# Patient Record
Sex: Female | Born: 1995 | Race: White | Hispanic: No | Marital: Single | Smoking: Never smoker
Health system: Southern US, Community
[De-identification: ages and names within clinical notes are randomized; demographics above are authoritative.]

## PROBLEM LIST (undated history)

## (undated) DIAGNOSIS — J45909 Unspecified asthma, uncomplicated: Secondary | ICD-10-CM

## (undated) HISTORY — PX: KNEE ARTHROSCOPY: SHX127

---
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
## 2015-07-17 ENCOUNTER — Ambulatory Visit: Payer: BC Managed Care – PPO | Admitting: Podiatry

## 2015-09-25 ENCOUNTER — Ambulatory Visit: Payer: BC Managed Care – PPO | Admitting: Podiatry

## 2020-02-03 ENCOUNTER — Other Ambulatory Visit: Payer: Self-pay | Admitting: Neurosurgery

## 2020-02-28 NOTE — Progress Notes (Signed)
[facility] Time, [place] - [address] AT [address] [place] [address] Phone: [phone] Fax: [phone]      Your procedure is scheduled on January 12  Report to [facility] Main Entrance "A" at [place].M., and check in at the Admitting office.  Call this number if you have problems the morning of surgery:  [phone]  Call [phone] if you have any questions prior to your surgery date Monday-Friday 8am-4pm    Remember:  Do not eat or drink after midnight the night before your surgery    Take these medicines the morning of surgery with A SIP OF WATER  albuterol (VENTOLIN HFA) if needed, Please bring all inhalers with you the day of surgery.  montelukast (SINGULAIR)  As of today, STOP taking any Aspirin (unless otherwise instructed by your surgeon) Aleve, Naproxen, Ibuprofen, Motrin, Advil, Goody's, BC's, all herbal medications, fish oil, and all vitamins.                      Do not wear jewelry, make up, or nail polish            Do not wear lotions, powders, perfumes, or deodorant.            Do not shave 48 hours prior to surgery.              Do not bring valuables to the hospital.            [facility] is not responsible for any belongings or valuables.  Do NOT Smoke (Tobacco/Vaping) or drink Alcohol 24 hours prior to your procedure If you use a CPAP at night, you may bring all equipment for your overnight stay.   Contacts, glasses, dentures or bridgework may not be worn into surgery.      For patients admitted to the hospital, discharge time will be determined by your treatment team.   Patients discharged the day of surgery will not be allowed to drive home, and someone needs to stay with them for 24 hours.    Special instructions:   [place]- Preparing For Surgery  Before surgery, you can play an important role. Because skin is not sterile, your skin needs to be as free of germs as possible. You  can reduce the number of germs on your skin by washing with CHG (chlorahexidine gluconate) Soap before surgery.  CHG is an antiseptic cleaner which kills germs and bonds with the skin to continue killing germs even after washing.    Oral Hygiene is also important to reduce your risk of infection.  Remember - BRUSH YOUR TEETH THE MORNING OF SURGERY WITH YOUR REGULAR TOOTHPASTE  Please do not use if you have an allergy to CHG or antibacterial soaps. If your skin becomes reddened/irritated stop using the CHG.  Do not shave (including legs and underarms) for at least 48 hours prior to first CHG shower. It is OK to shave your face.  Please follow these instructions carefully.   1. Shower the NIGHT BEFORE SURGERY and the MORNING OF SURGERY with CHG Soap.   2. If you chose to wash your hair, wash your hair first as usual with your normal shampoo.  3. After you shampoo, rinse your hair and body thoroughly to remove the shampoo.  4. Use CHG as you would any other liquid soap. You can apply CHG directly to the skin and wash gently with a  scrungie or a clean washcloth.   5. Apply the CHG Soap to your body ONLY FROM THE NECK DOWN.  Do not use on open wounds or open sores. Avoid contact with your eyes, ears, mouth and genitals (private parts). Wash Face and genitals (private parts)  with your normal soap.   6. Wash thoroughly, paying special attention to the area where your surgery will be performed.  7. Thoroughly rinse your body with warm water from the neck down.  8. DO NOT shower/wash with your normal soap after using and rinsing off the CHG Soap.  9. Pat yourself dry with a CLEAN TOWEL.  10. Wear CLEAN PAJAMAS to bed the night before surgery  11. Place CLEAN SHEETS on your bed the night of your first shower and DO NOT SLEEP WITH PETS.   Day of Surgery: Wear Clean/Comfortable clothing the morning of surgery Do not apply any deodorants/lotions.   Remember to brush your teeth WITH YOUR  REGULAR TOOTHPASTE.   Please read over the following fact sheets that you were given.

## 2020-03-02 ENCOUNTER — Encounter (HOSPITAL_COMMUNITY): Payer: Self-pay

## 2020-03-02 ENCOUNTER — Other Ambulatory Visit: Payer: Self-pay

## 2020-03-02 ENCOUNTER — Other Ambulatory Visit (HOSPITAL_COMMUNITY)
Admission: RE | Admit: 2020-03-02 | Discharge: 2020-03-02 | Disposition: A | Discharge status: Home / Self Care | Payer: 59 | Source: Ambulatory Visit | Attending: Neurosurgery | Admitting: Neurosurgery

## 2020-03-02 ENCOUNTER — Encounter (HOSPITAL_COMMUNITY)
Admission: RE | Admit: 2020-03-02 | Discharge: 2020-03-02 | Disposition: A | Discharge status: Home / Self Care | Payer: 59 | Source: Ambulatory Visit | Attending: Neurosurgery | Admitting: Neurosurgery

## 2020-03-02 DIAGNOSIS — Z20822 Contact with and (suspected) exposure to covid-19: Secondary | ICD-10-CM | POA: Insufficient documentation

## 2020-03-02 DIAGNOSIS — Z01812 Encounter for preprocedural laboratory examination: Secondary | ICD-10-CM | POA: Insufficient documentation

## 2020-03-02 HISTORY — DX: Unspecified asthma, uncomplicated: J45.909

## 2020-03-02 LAB — SARS CORONAVIRUS 2 (TAT 6-24 HRS): SARS Coronavirus 2: NEGATIVE

## 2020-03-02 LAB — TYPE AND SCREEN
ABO/RH(D): A POS
Antibody Screen: NEGATIVE

## 2020-03-02 LAB — SURGICAL PCR SCREEN
MRSA, PCR: NEGATIVE
Staphylococcus aureus: NEGATIVE

## 2020-03-02 NOTE — Progress Notes (Signed)
PCP - Dr Humphrey Rolls Cardiologist - n/a  Chest x-ray - n/a EKG - n/a Stress Test - n/a ECHO - n/a Cardiac Cath - n/a  Coronavirus Screening Covid test is scheduled on 03/02/20. Do you have any of the following symptoms:  Cough yes/no: No Fever (>100.44F)  yes/no: No Runny nose yes/no: No Sore throat yes/no: No Difficulty breathing/shortness of breath  yes/no: No  Have you traveled in the last 14 days and where? yes/no: No  Patient verbalized understanding of instructions that were given to them at the PAT appointment.

## 2020-03-03 NOTE — Anesthesia Preprocedure Evaluation (Signed)
Anesthesia Evaluation  Patient identified by MRN, date of birth, ID band Patient awake    Reviewed: Allergy & Precautions, NPO status , Patient's Chart, lab work & pertinent test results  History of Anesthesia Complications Negative for: history of anesthetic complications  Airway Mallampati: I  TM Distance: >3 FB Neck ROM: Full    Dental  (+) Dental Advisory Given, Teeth Intact   Pulmonary asthma , COPD: used inhaler this am.,  03/02/2020 SARS coronavirus NEG   breath sounds clear to auscultation       Cardiovascular negative cardio ROS   Rhythm:Regular Rate:Normal     Neuro/Psych negative neurological ROS     GI/Hepatic negative GI ROS, Neg liver ROS,   Endo/Other  negative endocrine ROS  Renal/GU negative Renal ROS     Musculoskeletal   Abdominal   Peds  Hematology negative hematology ROS (+)   Anesthesia Other Findings   Reproductive/Obstetrics                            Anesthesia Physical Anesthesia Plan  ASA: II  Anesthesia Plan: General   Post-op Pain Management:    Induction: Intravenous  PONV Risk Score and Plan: 3 and Dexamethasone and Scopolamine patch - Pre-op  Airway Management Planned: Oral ETT  Additional Equipment: None  Intra-op Plan:   Post-operative Plan: Extubation in OR  Informed Consent: I have reviewed the patients History and Physical, chart, labs and discussed the procedure including the risks, benefits and alternatives for the proposed anesthesia with the patient or authorized representative who has indicated his/her understanding and acceptance.     Dental advisory given  Plan Discussed with: CRNA and Surgeon  Anesthesia Plan Comments:        Anesthesia Quick Evaluation

## 2020-03-04 ENCOUNTER — Encounter (HOSPITAL_COMMUNITY)
Admission: RE | Disposition: A | Discharge status: Home / Self Care | Payer: Self-pay | Source: Home / Self Care | Attending: Neurosurgery

## 2020-03-04 ENCOUNTER — Inpatient Hospital Stay (HOSPITAL_COMMUNITY): Payer: 59 | Admitting: Anesthesiology

## 2020-03-04 ENCOUNTER — Other Ambulatory Visit: Payer: Self-pay

## 2020-03-04 ENCOUNTER — Inpatient Hospital Stay (HOSPITAL_COMMUNITY): Payer: 59

## 2020-03-04 ENCOUNTER — Inpatient Hospital Stay (HOSPITAL_COMMUNITY)
Admission: RE | Admit: 2020-03-04 | Discharge: 2020-03-07 | DRG: 030 | Disposition: A | Discharge status: Home / Self Care | Payer: 59 | Attending: Neurosurgery | Admitting: Neurosurgery

## 2020-03-04 ENCOUNTER — Encounter (HOSPITAL_COMMUNITY): Payer: Self-pay | Admitting: Neurosurgery

## 2020-03-04 DIAGNOSIS — Z419 Encounter for procedure for purposes other than remedying health state, unspecified: Secondary | ICD-10-CM

## 2020-03-04 DIAGNOSIS — D334 Benign neoplasm of spinal cord: Principal | ICD-10-CM | POA: Diagnosis present

## 2020-03-04 DIAGNOSIS — Z20822 Contact with and (suspected) exposure to covid-19: Secondary | ICD-10-CM | POA: Diagnosis present

## 2020-03-04 DIAGNOSIS — J45909 Unspecified asthma, uncomplicated: Secondary | ICD-10-CM | POA: Diagnosis present

## 2020-03-04 DIAGNOSIS — C719 Malignant neoplasm of brain, unspecified: Secondary | ICD-10-CM | POA: Diagnosis present

## 2020-03-04 HISTORY — PX: LAMINECTOMY: SHX219

## 2020-03-04 LAB — CBC
HCT: 38.7 % (ref 36.0–46.0)
Hemoglobin: 13 g/dL (ref 12.0–15.0)
MCH: 30.5 pg (ref 26.0–34.0)
MCHC: 33.6 g/dL (ref 30.0–36.0)
MCV: 90.8 fL (ref 80.0–100.0)
Platelets: 235 10*3/uL (ref 150–400)
RBC: 4.26 MIL/uL (ref 3.87–5.11)
RDW: 13 % (ref 11.5–15.5)
WBC: 10.7 10*3/uL — ABNORMAL HIGH (ref 4.0–10.5)
nRBC: 0 % (ref 0.0–0.2)

## 2020-03-04 LAB — COMPREHENSIVE METABOLIC PANEL
ALT: 44 U/L (ref 0–44)
ALT: 45 U/L — ABNORMAL HIGH (ref 0–44)
AST: 47 U/L — ABNORMAL HIGH (ref 15–41)
AST: 45 U/L — ABNORMAL HIGH (ref 15–41)
Albumin: 4.1 g/dL (ref 3.5–5.0)
Albumin: 3.9 g/dL (ref 3.5–5.0)
Alkaline Phosphatase: 48 U/L (ref 38–126)
Alkaline Phosphatase: 48 U/L (ref 38–126)
Anion gap: 11 (ref 5–15)
Anion gap: 10 (ref 5–15)
BUN: 7 mg/dL (ref 6–20)
BUN: 6 mg/dL (ref 6–20)
CO2: 23 mmol/L (ref 22–32)
CO2: 23 mmol/L (ref 22–32)
Calcium: 9.6 mg/dL (ref 8.9–10.3)
Calcium: 9.2 mg/dL (ref 8.9–10.3)
Chloride: 106 mmol/L (ref 98–111)
Chloride: 104 mmol/L (ref 98–111)
Creatinine, Ser: 0.57 mg/dL (ref 0.44–1.00)
Creatinine, Ser: 0.61 mg/dL (ref 0.44–1.00)
GFR, Estimated: >60 mL/min (ref >60)
GFR, Estimated: >60 mL/min (ref >60)
Glucose, Bld: 89 mg/dL (ref 70–99)
Glucose, Bld: 115 mg/dL — ABNORMAL HIGH (ref 70–99)
Potassium: 3.9 mmol/L (ref 3.5–5.1)
Potassium: 3.6 mmol/L (ref 3.5–5.1)
Sodium: 140 mmol/L (ref 135–145)
Sodium: 137 mmol/L (ref 135–145)
Total Bilirubin: 0.4 mg/dL (ref 0.3–1.2)
Total Bilirubin: 0.9 mg/dL (ref 0.3–1.2)
Total Protein: 6.8 g/dL (ref 6.5–8.1)
Total Protein: 6.4 g/dL — ABNORMAL LOW (ref 6.5–8.1)

## 2020-03-04 LAB — PHOSPHORUS: Phosphorus: 3.8 mg/dL (ref 2.5–4.6)

## 2020-03-04 LAB — POCT PREGNANCY, URINE: Preg Test, Ur: NEGATIVE

## 2020-03-04 LAB — MAGNESIUM: Magnesium: 1.6 mg/dL — ABNORMAL LOW (ref 1.7–2.4)

## 2020-03-04 LAB — ABO/RH: ABO/RH(D): A POS

## 2020-03-04 SURGERY — THORACIC LAMINECTOMY FOR TUMOR
Anesthesia: General | Site: Spine Lumbar

## 2020-03-04 MED ORDER — LIDOCAINE 2% (20 MG/ML) 5 ML SYRINGE
INTRAMUSCULAR | Status: AC
  Filled 2020-03-04: qty 5

## 2020-03-04 MED ORDER — BISACODYL 5 MG PO TBEC: 5.0000 mg | DELAYED_RELEASE_TABLET | Freq: Every day | ORAL | Status: DC | PRN

## 2020-03-04 MED ORDER — LIDOCAINE 2% (20 MG/ML) 5 ML SYRINGE
INTRAMUSCULAR | Status: DC | PRN
  Administered 2020-03-04: 40 mg via INTRAVENOUS

## 2020-03-04 MED ORDER — ACETAMINOPHEN 500 MG PO TABS
1000.0000 mg | ORAL_TABLET | Freq: Once | ORAL | Status: AC
  Administered 2020-03-04: 1000 mg via ORAL
  Filled 2020-03-04: qty 2

## 2020-03-04 MED ORDER — THROMBIN 20000 UNITS EX SOLR
CUTANEOUS | Status: DC | PRN
  Administered 2020-03-04: 20 mL via TOPICAL

## 2020-03-04 MED ORDER — ACETAMINOPHEN 325 MG PO TABS: 650.0000 mg | ORAL_TABLET | ORAL | Status: DC | PRN

## 2020-03-04 MED ORDER — LACTATED RINGERS IV SOLN: INTRAVENOUS | Status: DC

## 2020-03-04 MED ORDER — LORAZEPAM 2 MG/ML IJ SOLN: 1.0000 mg | INTRAMUSCULAR | Status: DC | PRN

## 2020-03-04 MED ORDER — BUPIVACAINE HCL (PF) 0.5 % IJ SOLN
INTRAMUSCULAR | Status: AC
  Filled 2020-03-04: qty 30

## 2020-03-04 MED ORDER — OXYCODONE HCL 5 MG/5ML PO SOLN: 5.0000 mg | Freq: Once | ORAL | Status: DC | PRN

## 2020-03-04 MED ORDER — DIAZEPAM 5 MG PO TABS
5.0000 mg | ORAL_TABLET | Freq: Four times a day (QID) | ORAL | Status: DC | PRN
  Administered 2020-03-04 – 2020-03-05 (×4): 5 mg via ORAL
  Filled 2020-03-04 (×4): qty 1

## 2020-03-04 MED ORDER — HYDROMORPHONE HCL 1 MG/ML IJ SOLN
0.2500 mg | INTRAMUSCULAR | Status: DC | PRN
Start: 2020-03-04 — End: 2020-03-04
  Administered 2020-03-04 (×2): 0.5 mg via INTRAVENOUS

## 2020-03-04 MED ORDER — SENNOSIDES-DOCUSATE SODIUM 8.6-50 MG PO TABS
1.0000 | ORAL_TABLET | Freq: Every evening | ORAL | Status: DC | PRN
Start: 2020-03-04 — End: 2020-03-07

## 2020-03-04 MED ORDER — ADULT MULTIVITAMIN W/MINERALS CH
1.0000 | ORAL_TABLET | Freq: Every day | ORAL | Status: DC
  Administered 2020-03-05 – 2020-03-07 (×3): 1 via ORAL
  Filled 2020-03-04 (×3): qty 1

## 2020-03-04 MED ORDER — DEXAMETHASONE 4 MG PO TABS
4.0000 mg | ORAL_TABLET | Freq: Four times a day (QID) | ORAL | Status: DC
  Administered 2020-03-04 – 2020-03-06 (×8): 4 mg via ORAL
  Filled 2020-03-04 (×8): qty 1

## 2020-03-04 MED ORDER — THROMBIN 20000 UNITS EX SOLR
CUTANEOUS | Status: AC
  Filled 2020-03-04: qty 20000

## 2020-03-04 MED ORDER — LIDOCAINE-EPINEPHRINE 0.5 %-1:200000 IJ SOLN
INTRAMUSCULAR | Status: AC
  Filled 2020-03-04: qty 1

## 2020-03-04 MED ORDER — SODIUM CHLORIDE 0.9 % IV SOLN: 250.0000 mL | INTRAVENOUS | Status: DC

## 2020-03-04 MED ORDER — FENTANYL CITRATE (PF) 100 MCG/2ML IJ SOLN
INTRAMUSCULAR | Status: DC | PRN
  Administered 2020-03-04 (×2): 50 ug via INTRAVENOUS
  Administered 2020-03-04: 150 ug via INTRAVENOUS

## 2020-03-04 MED ORDER — MORPHINE SULFATE (PF) 2 MG/ML IV SOLN
2.0000 mg | INTRAVENOUS | Status: DC | PRN
  Administered 2020-03-06: 2 mg via INTRAVENOUS
  Filled 2020-03-04: qty 1

## 2020-03-04 MED ORDER — CHLORHEXIDINE GLUCONATE CLOTH 2 % EX PADS: 6.0000 | MEDICATED_PAD | Freq: Once | CUTANEOUS | Status: DC

## 2020-03-04 MED ORDER — ROCURONIUM BROMIDE 10 MG/ML (PF) SYRINGE
PREFILLED_SYRINGE | INTRAVENOUS | Status: AC
  Filled 2020-03-04: qty 10

## 2020-03-04 MED ORDER — MIDAZOLAM HCL 5 MG/5ML IJ SOLN
INTRAMUSCULAR | Status: DC | PRN
  Administered 2020-03-04: 2 mg via INTRAVENOUS

## 2020-03-04 MED ORDER — ROCURONIUM BROMIDE 10 MG/ML (PF) SYRINGE
PREFILLED_SYRINGE | INTRAVENOUS | Status: DC | PRN
  Administered 2020-03-04: 20 mg via INTRAVENOUS
  Administered 2020-03-04: 10 mg via INTRAVENOUS
  Administered 2020-03-04: 70 mg via INTRAVENOUS

## 2020-03-04 MED ORDER — PROMETHAZINE HCL 25 MG/ML IJ SOLN: 6.2500 mg | INTRAMUSCULAR | Status: DC | PRN

## 2020-03-04 MED ORDER — POTASSIUM CHLORIDE IN NACL 20-0.9 MEQ/L-% IV SOLN
INTRAVENOUS | Status: DC
  Filled 2020-03-04 (×3): qty 1000

## 2020-03-04 MED ORDER — LIDOCAINE-EPINEPHRINE 0.5 %-1:200000 IJ SOLN
INTRAMUSCULAR | Status: DC | PRN
  Administered 2020-03-04: 4 mL

## 2020-03-04 MED ORDER — CHLORHEXIDINE GLUCONATE 0.12 % MT SOLN
15.0000 mL | Freq: Once | OROMUCOSAL | Status: AC
  Administered 2020-03-04: 15 mL via OROMUCOSAL
  Filled 2020-03-04: qty 15

## 2020-03-04 MED ORDER — DOCUSATE SODIUM 100 MG PO CAPS
100.0000 mg | ORAL_CAPSULE | Freq: Two times a day (BID) | ORAL | Status: DC
  Administered 2020-03-06 – 2020-03-07 (×3): 100 mg via ORAL
  Filled 2020-03-04 (×4): qty 1

## 2020-03-04 MED ORDER — ORAL CARE MOUTH RINSE: 15.0000 mL | Freq: Once | OROMUCOSAL | Status: AC

## 2020-03-04 MED ORDER — BUPIVACAINE HCL 0.5 % IJ SOLN
INTRAMUSCULAR | Status: DC | PRN
  Administered 2020-03-04: 20 mL

## 2020-03-04 MED ORDER — PHENYLEPHRINE 40 MCG/ML (10ML) SYRINGE FOR IV PUSH (FOR BLOOD PRESSURE SUPPORT)
PREFILLED_SYRINGE | INTRAVENOUS | Status: AC
  Filled 2020-03-04: qty 10

## 2020-03-04 MED ORDER — ONDANSETRON HCL 4 MG PO TABS: 4.0000 mg | ORAL_TABLET | Freq: Four times a day (QID) | ORAL | Status: DC | PRN

## 2020-03-04 MED ORDER — SUGAMMADEX SODIUM 200 MG/2ML IV SOLN
INTRAVENOUS | Status: DC | PRN
  Administered 2020-03-04: 150 mg via INTRAVENOUS

## 2020-03-04 MED ORDER — GABAPENTIN 300 MG PO CAPS
300.0000 mg | ORAL_CAPSULE | Freq: Three times a day (TID) | ORAL | Status: DC
  Administered 2020-03-04 – 2020-03-07 (×9): 300 mg via ORAL
  Filled 2020-03-04 (×9): qty 1

## 2020-03-04 MED ORDER — ONDANSETRON HCL 4 MG/2ML IJ SOLN
INTRAMUSCULAR | Status: DC | PRN
  Administered 2020-03-04: 4 mg via INTRAVENOUS

## 2020-03-04 MED ORDER — ACETAMINOPHEN 650 MG RE SUPP: 650.0000 mg | RECTAL | Status: DC | PRN

## 2020-03-04 MED ORDER — MAGNESIUM CITRATE PO SOLN: 1.0000 | Freq: Once | ORAL | Status: DC | PRN

## 2020-03-04 MED ORDER — MIDAZOLAM HCL 2 MG/2ML IJ SOLN: 0.5000 mg | Freq: Once | INTRAMUSCULAR | Status: DC | PRN

## 2020-03-04 MED ORDER — DEXAMETHASONE SODIUM PHOSPHATE 10 MG/ML IJ SOLN
INTRAMUSCULAR | Status: DC | PRN
  Administered 2020-03-04: 10 mg via INTRAVENOUS

## 2020-03-04 MED ORDER — CEFAZOLIN SODIUM-DEXTROSE 2-4 GM/100ML-% IV SOLN
2.0000 g | INTRAVENOUS | Status: AC
  Administered 2020-03-04: 2 g via INTRAVENOUS
  Filled 2020-03-04: qty 100

## 2020-03-04 MED ORDER — PHENOL 1.4 % MT LIQD: 1.0000 | OROMUCOSAL | Status: DC | PRN

## 2020-03-04 MED ORDER — BACITRACIN ZINC 500 UNIT/GM EX OINT
TOPICAL_OINTMENT | CUTANEOUS | Status: AC
  Filled 2020-03-04: qty 28.35

## 2020-03-04 MED ORDER — FOLIC ACID 1 MG PO TABS
1.0000 mg | ORAL_TABLET | Freq: Every day | ORAL | Status: DC
  Administered 2020-03-05 – 2020-03-07 (×3): 1 mg via ORAL
  Filled 2020-03-04 (×3): qty 1

## 2020-03-04 MED ORDER — DEXAMETHASONE SODIUM PHOSPHATE 4 MG/ML IJ SOLN: 4.0000 mg | Freq: Four times a day (QID) | INTRAMUSCULAR | Status: DC

## 2020-03-04 MED ORDER — SODIUM CHLORIDE 0.9% FLUSH: 3.0000 mL | INTRAVENOUS | Status: DC | PRN

## 2020-03-04 MED ORDER — LORAZEPAM 1 MG PO TABS
1.0000 mg | ORAL_TABLET | ORAL | Status: DC | PRN
Start: 2020-03-04 — End: 2020-03-07

## 2020-03-04 MED ORDER — OXYCODONE HCL 5 MG PO TABS: 5.0000 mg | ORAL_TABLET | Freq: Once | ORAL | Status: DC | PRN

## 2020-03-04 MED ORDER — ALBUTEROL SULFATE (2.5 MG/3ML) 0.083% IN NEBU: 2.5000 mg | INHALATION_SOLUTION | Freq: Four times a day (QID) | RESPIRATORY_TRACT | Status: DC | PRN

## 2020-03-04 MED ORDER — SCOPOLAMINE 1 MG/3DAYS TD PT72
1.0000 | MEDICATED_PATCH | TRANSDERMAL | Status: DC
  Administered 2020-03-04: 1.5 mg via TRANSDERMAL
  Filled 2020-03-04: qty 1

## 2020-03-04 MED ORDER — CHLORHEXIDINE GLUCONATE CLOTH 2 % EX PADS
6.0000 | MEDICATED_PAD | Freq: Once | CUTANEOUS | Status: DC
  Administered 2020-03-04: 6 via TOPICAL

## 2020-03-04 MED ORDER — THIAMINE HCL 100 MG/ML IJ SOLN: 100.0000 mg | Freq: Every day | INTRAMUSCULAR | Status: DC

## 2020-03-04 MED ORDER — FENTANYL CITRATE (PF) 250 MCG/5ML IJ SOLN
INTRAMUSCULAR | Status: AC
  Filled 2020-03-04: qty 5

## 2020-03-04 MED ORDER — PROPOFOL 10 MG/ML IV BOLUS
INTRAVENOUS | Status: DC | PRN
  Administered 2020-03-04: 200 mg via INTRAVENOUS

## 2020-03-04 MED ORDER — SODIUM CHLORIDE 0.9% FLUSH
3.0000 mL | Freq: Two times a day (BID) | INTRAVENOUS | Status: DC
  Administered 2020-03-05 – 2020-03-06 (×3): 3 mL via INTRAVENOUS

## 2020-03-04 MED ORDER — ONDANSETRON HCL 4 MG/2ML IJ SOLN: 4.0000 mg | Freq: Four times a day (QID) | INTRAMUSCULAR | Status: DC | PRN

## 2020-03-04 MED ORDER — OXYCODONE HCL ER 10 MG PO T12A
10.0000 mg | EXTENDED_RELEASE_TABLET | Freq: Two times a day (BID) | ORAL | Status: DC
  Administered 2020-03-04 – 2020-03-07 (×6): 10 mg via ORAL
  Filled 2020-03-04 (×6): qty 1

## 2020-03-04 MED ORDER — DEXAMETHASONE SODIUM PHOSPHATE 10 MG/ML IJ SOLN
INTRAMUSCULAR | Status: AC
  Filled 2020-03-04: qty 1

## 2020-03-04 MED ORDER — HYDROMORPHONE HCL 1 MG/ML IJ SOLN
INTRAMUSCULAR | Status: AC
  Filled 2020-03-04: qty 1

## 2020-03-04 MED ORDER — THIAMINE HCL 100 MG PO TABS
100.0000 mg | ORAL_TABLET | Freq: Every day | ORAL | Status: DC
  Administered 2020-03-05 – 2020-03-07 (×3): 100 mg via ORAL
  Filled 2020-03-04 (×3): qty 1

## 2020-03-04 MED ORDER — HYDROCODONE-ACETAMINOPHEN 5-325 MG PO TABS
1.0000 | ORAL_TABLET | ORAL | Status: DC | PRN
  Administered 2020-03-04 – 2020-03-07 (×8): 1 via ORAL
  Filled 2020-03-04 (×8): qty 1

## 2020-03-04 MED ORDER — CELECOXIB 200 MG PO CAPS
200.0000 mg | ORAL_CAPSULE | Freq: Two times a day (BID) | ORAL | Status: DC
  Administered 2020-03-04 – 2020-03-07 (×6): 200 mg via ORAL
  Filled 2020-03-04 (×7): qty 1

## 2020-03-04 MED ORDER — ADULT MULTIVITAMIN W/MINERALS CH: 1.0000 | ORAL_TABLET | Freq: Every day | ORAL | Status: DC

## 2020-03-04 MED ORDER — PROPOFOL 10 MG/ML IV BOLUS
INTRAVENOUS | Status: AC
  Filled 2020-03-04: qty 20

## 2020-03-04 MED ORDER — PHENYLEPHRINE 40 MCG/ML (10ML) SYRINGE FOR IV PUSH (FOR BLOOD PRESSURE SUPPORT)
PREFILLED_SYRINGE | INTRAVENOUS | Status: DC | PRN
  Administered 2020-03-04 (×2): 40 ug via INTRAVENOUS

## 2020-03-04 MED ORDER — MENTHOL 3 MG MT LOZG
1.0000 | LOZENGE | OROMUCOSAL | Status: DC | PRN
  Filled 2020-03-04: qty 9

## 2020-03-04 MED ORDER — THIAMINE HCL 100 MG PO TABS: 100.0000 mg | ORAL_TABLET | Freq: Every day | ORAL | Status: DC

## 2020-03-04 MED ORDER — HYDROCODONE-ACETAMINOPHEN 7.5-325 MG PO TABS
2.0000 | ORAL_TABLET | ORAL | Status: DC | PRN
  Administered 2020-03-05: 2 via ORAL
  Filled 2020-03-04 (×2): qty 2

## 2020-03-04 MED ORDER — MONTELUKAST SODIUM 10 MG PO TABS
10.0000 mg | ORAL_TABLET | Freq: Every day | ORAL | Status: DC | PRN
Start: 2020-03-04 — End: 2020-03-07
  Filled 2020-03-04: qty 1

## 2020-03-04 MED ORDER — CHLORHEXIDINE GLUCONATE CLOTH 2 % EX PADS
6.0000 | MEDICATED_PAD | Freq: Every day | CUTANEOUS | Status: DC
  Administered 2020-03-04 – 2020-03-05 (×2): 6 via TOPICAL

## 2020-03-04 MED ORDER — MIDAZOLAM HCL 2 MG/2ML IJ SOLN
INTRAMUSCULAR | Status: AC
  Filled 2020-03-04: qty 2

## 2020-03-04 MED ORDER — PROPOFOL 10 MG/ML IV BOLUS
INTRAVENOUS | Status: AC
  Filled 2020-03-04: qty 40

## 2020-03-04 MED ORDER — MEPERIDINE HCL 25 MG/ML IJ SOLN: 6.2500 mg | INTRAMUSCULAR | Status: DC | PRN

## 2020-03-04 SURGICAL SUPPLY — 73 items
ADH SKN CLS APL DERMABOND .7 (GAUZE/BANDAGES/DRESSINGS) ×1
BAND INSRT 18 STRL LF DISP RB (MISCELLANEOUS) ×2
BAND RUBBER #18 3X1/16 STRL (MISCELLANEOUS) ×4 IMPLANT
BLADE CLIPPER SURG (BLADE) IMPLANT
BLADE SURG 11 STRL SS (BLADE) IMPLANT
BUR MATCHSTICK NEURO 3.0 LAGG (BURR) ×1 IMPLANT
CANISTER SUCT 3000ML PPV (MISCELLANEOUS) ×2 IMPLANT
CARTRIDGE OIL MAESTRO DRILL (MISCELLANEOUS) ×1 IMPLANT
CNTNR URN SCR LID CUP LEK RST (MISCELLANEOUS) IMPLANT
CONT SPEC 4OZ CLIKSEAL STRL BL (MISCELLANEOUS) ×2 IMPLANT
CONT SPEC 4OZ STRL OR WHT (MISCELLANEOUS) ×2
COVER WAND RF STERILE (DRAPES) ×1 IMPLANT
DERMABOND ADVANCED (GAUZE/BANDAGES/DRESSINGS) ×1
DERMABOND ADVANCED .7 DNX12 (GAUZE/BANDAGES/DRESSINGS) ×1 IMPLANT
DIFFUSER DRILL AIR PNEUMATIC (MISCELLANEOUS) ×2 IMPLANT
DRAPE LAPAROTOMY 100X72X124 (DRAPES) ×2 IMPLANT
DRAPE LAPAROTOMY T 102X78X121 (DRAPES) IMPLANT
DRAPE MICROSCOPE LEICA (MISCELLANEOUS) ×2 IMPLANT
DRAPE SURG 17X23 STRL (DRAPES) ×4 IMPLANT
DRSG OPSITE POSTOP 4X8 (GAUZE/BANDAGES/DRESSINGS) ×1 IMPLANT
DRSG TELFA 3X8 NADH (GAUZE/BANDAGES/DRESSINGS) ×2 IMPLANT
ELECT REM PT RETURN 9FT ADLT (ELECTROSURGICAL) ×2
ELECTRODE REM PT RTRN 9FT ADLT (ELECTROSURGICAL) ×1 IMPLANT
GAUZE 4X4 16PLY RFD (DISPOSABLE) IMPLANT
GAUZE SPONGE 4X4 12PLY STRL (GAUZE/BANDAGES/DRESSINGS) ×1 IMPLANT
GLOVE BIO SURGEON STRL SZ8 (GLOVE) ×1 IMPLANT
GLOVE ECLIPSE 6.5 STRL STRAW (GLOVE) ×3 IMPLANT
GLOVE ECLIPSE 7.5 STRL STRAW (GLOVE) ×3 IMPLANT
GLOVE EXAM NITRILE XL STR (GLOVE) IMPLANT
GLOVE SURG SS PI 8.5 STRL IVOR (GLOVE) ×1
GLOVE SURG SS PI 8.5 STRL STRW (GLOVE) IMPLANT
GOWN STRL REUS W/ TWL LRG LVL3 (GOWN DISPOSABLE) IMPLANT
GOWN STRL REUS W/ TWL XL LVL3 (GOWN DISPOSABLE) IMPLANT
GOWN STRL REUS W/TWL 2XL LVL3 (GOWN DISPOSABLE) ×3 IMPLANT
GOWN STRL REUS W/TWL LRG LVL3 (GOWN DISPOSABLE) ×4
GOWN STRL REUS W/TWL XL LVL3 (GOWN DISPOSABLE) ×2
HEMOSTAT SURGICEL 2X14 (HEMOSTASIS) IMPLANT
KIT BASIN OR (CUSTOM PROCEDURE TRAY) ×2 IMPLANT
KIT TURNOVER KIT B (KITS) ×2 IMPLANT
KNIFE ARACHNOID DISP AM-24-XSB (BLADE) ×1 IMPLANT
NDL SPNL 20GX3.5 QUINCKE YW (NEEDLE) IMPLANT
NDL SPNL 25GX3.5 QUINCKE BL (NEEDLE) IMPLANT
NEEDLE HYPO 22GX1.5 SAFETY (NEEDLE) ×2 IMPLANT
NEEDLE SPNL 20GX3.5 QUINCKE YW (NEEDLE) ×4 IMPLANT
NEEDLE SPNL 25GX3.5 QUINCKE BL (NEEDLE) ×2 IMPLANT
NS IRRIG 1000ML POUR BTL (IV SOLUTION) ×2 IMPLANT
OIL CARTRIDGE MAESTRO DRILL (MISCELLANEOUS) ×2
PACK LAMINECTOMY NEURO (CUSTOM PROCEDURE TRAY) ×2 IMPLANT
PAD DRESSING TELFA 3X8 NADH (GAUZE/BANDAGES/DRESSINGS) IMPLANT
PATTIES SURGICAL .5 X.5 (GAUZE/BANDAGES/DRESSINGS) IMPLANT
PATTIES SURGICAL .5 X3 (DISPOSABLE) IMPLANT
PATTIES SURGICAL 1/4 X 3 (GAUZE/BANDAGES/DRESSINGS) ×1 IMPLANT
SPONGE LAP 4X18 RFD (DISPOSABLE) IMPLANT
SPONGE SURGIFOAM ABS GEL 100 (HEMOSTASIS) ×1 IMPLANT
STAPLER VISISTAT 35W (STAPLE) ×2 IMPLANT
SUT BONE WAX W31G (SUTURE) IMPLANT
SUT ETHILON 4 0 PS 2 18 (SUTURE) ×1 IMPLANT
SUT NURALON 4 0 TR CR/8 (SUTURE) ×1 IMPLANT
SUT PROLENE 5 0 C1 (SUTURE) ×2 IMPLANT
SUT PROLENE 6 0 BV (SUTURE) ×1 IMPLANT
SUT VIC AB 0 CT1 18XCR BRD8 (SUTURE) IMPLANT
SUT VIC AB 0 CT1 8-18 (SUTURE) ×6
SUT VIC AB 2-0 CT1 18 (SUTURE) ×1 IMPLANT
SUT VIC AB 3-0 SH 8-18 (SUTURE) ×3 IMPLANT
SUT VICRYL 4-0 PS2 18IN ABS (SUTURE) IMPLANT
SYR CONTROL 10ML LL (SYRINGE) ×2 IMPLANT
SYR TB 1ML LUER SLIP (SYRINGE) ×1 IMPLANT
TOWEL GREEN STERILE (TOWEL DISPOSABLE) ×2 IMPLANT
TOWEL GREEN STERILE FF (TOWEL DISPOSABLE) ×2 IMPLANT
TRAY FOLEY MTR SLVR 14FR STAT (SET/KITS/TRAYS/PACK) ×2 IMPLANT
TRAY FOLEY MTR SLVR 16FR STAT (SET/KITS/TRAYS/PACK) IMPLANT
TUBE CONNECTING 12X1/4 (SUCTIONS) IMPLANT
WATER STERILE IRR 1000ML POUR (IV SOLUTION) ×2 IMPLANT

## 2020-03-04 NOTE — Transfer of Care (Signed)
Immediate Anesthesia Transfer of Care Note  Patient: Emily Bowers  Procedure(s) Performed: Thoracic Twelve- Lumbar One Laminectomy for intradural tumor resection (N/A Spine Lumbar)  Patient Location: PACU  Anesthesia Type:General  Level of Consciousness: awake and alert   Airway & Oxygen Therapy: Patient Spontanous Breathing and Patient connected to nasal cannula oxygen  Post-op Assessment: Report given to RN and Post -op Vital signs reviewed and stable  Post vital signs: Reviewed and stable  Last Vitals:  Vitals Value Taken Time  BP 115/79 03/04/20 1350  Temp    Pulse 100 03/04/20 1351  Resp 48 03/04/20 1351  SpO2 100 % 03/04/20 1351  Vitals shown include unvalidated device data.  Last Pain:  Vitals:   03/04/20 0739  PainSc: 3       Patients Stated Pain Goal: 3 (66/44/03 4742)  Complications: No complications documented.

## 2020-03-04 NOTE — Progress Notes (Signed)
Patient complains of severe tingling down the left leg. Dr. Christella Noa at bedside and indicates this is to be expected. Patient understands.

## 2020-03-04 NOTE — Op Note (Signed)
03/04/2020  2:37 PM  PATIENT:  Emily Bowers  25 y.o. female  PRE-OPERATIVE DIAGNOSIS:  Ependymoma  POST-OPERATIVE DIAGNOSIS:  Cauda equina mass PROCEDURE:  Procedure(s): Thoracic Twelve- Lumbar One Laminectomy for intradural tumor resection, microdissection.   SURGEON: Surgeon(s): Ashok Pall, MD Kary Kos, MD  ASSISTANTS:Cram, Dominica Severin  ANESTHESIA:   general  EBL:  Total I/O In: 1400 [I.V.:1400] Out: 21 [Urine:200; Blood:75]  BLOOD ADMINISTERED:none  CELL SAVER GIVEN:none  COUNT:per nursing  DRAINS: none   SPECIMEN:  Source of Specimen:  intradural   DICTATION: Emily Locus Naas was taken to the operating room, intubated, and placed under a general anesthetic without difficulty. She was positioned prone on a Wilson frame with all pressure points properly padded. She was prepped and draped in a sterile manner. I infiltrated lidocaine in the planned incision. I opened the skin with a 10 blade, dissected sharply to the thoracolumbar fascia, then exposed the lamina of T11,12,L1, bilaterally. I performed a laminectomy of T12 and L1 and exposed the thecal sac.  I opened the dura with a 15 blade, and secured the dural edges with suture to lay out the intradural contents. The conus was proximal to the mass. The nerve roots were not freely mobile, and I did take a number of films to establish my location. Finally I exposed a greyish membrane/capsule, and was able to sweep the nerve roots to the right exposing the mass. It was cystic on the MRI, so I placed a needle into the mass. No fluid emanated from the needle, even after aspiration. I then made a linear incision in the capsule and removed solid material from the interior of the mass. I was able to lift the mass and roll it rostrally, I was also able to roll  It caudally. With Dr. Windy Carina assistance we delivered the mass posteriorly above the nerve roots. With countertraction and the bipolar cautery we were able to see the attachment to a  neural structure leading directly into the tumor. That was divided with scissors and cautery, and the distal connection was too identified and divided in similar fashion. There was no significant bleeding, and irrigation controlled the oozing from the canal. The tumor resection was performed with microdissection.  I closed the dural incision primarily with suture, prolene. I then closed the incision approximating the thoracolumbar fascia, subcutaneous, and subcuticular planes with vicryl suture. I applied a sterile dressing. She was extubated in the room and moving all extremities.  PLAN OF CARE: Admit to inpatient   PATIENT DISPOSITION:  PACU - hemodynamically stable.   Delay start of Pharmacological VTE agent (>24hrs) due to surgical blood loss or risk of bleeding:  yes

## 2020-03-04 NOTE — H&P (Signed)
BP 119/76   Pulse 93   Temp 98.3 F (36.8 C)   Resp 18   Ht 5\' 7"  (1.702 m)   Wt 58.1 kg   LMP 02/20/2020 (Approximate)   SpO2 99%   BMI 20.06 kg/m  Emily Bowers is a young woman who has been having pain for about a year, especially in the left lower extremity, left lower back.  She has experienced weakness in the left lower extremity, where it will simply give out.  The pain became worse, since July of this year, which prompted an MRI scan of the lumbar spine.  What that showed was an intradural mass located mainly behind the body of T12.  The mass is about 1.3 x 1.2 x 3 cm in length.  It is cystic to a degree.  It could be an ependymoma, it could be a nerve sheath tumor.  She weighs 131 pounds.  Temperature is 97, blood pressure is 115/81, pulse is 92, pain is 9/10.  She has been taking Hydrocodone for the pain and Gabapentin.  She is otherwise healthy.  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  She played college ball for approximately a year, so she is in shape and was well aware that things were not quite right.  She finds it easier to get in and out of her truck than it is to get in and out of her Mustang.  That also has a clutch, and she has not been driving much because of the weakness she experiences in the left lower extremity.     PHYSICAL EXAMINATION :  She is alert, oriented by 4. She answers all questions appropriately.  Memory, language, attention span, and fund of knowledge are normal.  Speech is clear and fluent.  Hearing is intact to voice.  She has normal shoulder shrug. Normal muscle tone and bulk.  Romberg is negative.  She has 5/5 strength in the right lower extremity weakness in the left hip flexors, some in the quadriceps 4/5.  Dorsiflexion and plantar flexion appear to be normal.  She has normal strength 5/5 in the upper extremities.  2+ reflexes throughout.  No clonus.  No Hoffmann sign.     MEDICATIONS :  She also takes Ibuprofen 1 tablet 3 times a  day.     ALLERGIES :  No known drug allergies.     PAST SURGICAL HISTORY :  She had a meniscus surgery on the left knee in 2018.     REVIEW OF SYSTEMS :  Positive for weakness in the left lower extremity, pain, and back pain.     PAST MEDICAL HISTORY :  Also includes asthma.  She has no bowel or bladder dysfunction.  She does live alone.  She does not have children.  She does use alcohol.  She does not smoke.     PLAN :  I will see her after January 1st to resect the tumor.  It certainly needs to come out.  I think it is more likely an ependymoma than a nerve sheath tumor, but it could be one or the other.  I did explain that if it is a nerve sheath tumor and we would have to take the nerve root as well, I will use some neuromonitoring for the lower extremities.  I would expect her to be in the hospital approximately 3 days.  I would expect her to be walking when she leaves the hospital.  I told her this does  not involve fusion. No screws.  No hardware or anything like that.  Risks and benefits, bleeding, infection, damage to the spinal cord, damage to the nerve roots, no improvement in her symptoms, inability to remove the tumor, recurrence of the tumor, and other risks were explained.  She understands and wishes to proceed.

## 2020-03-04 NOTE — Anesthesia Procedure Notes (Signed)
Procedure Name: Intubation Date/Time: 03/04/2020 10:10 AM Performed by: Inda Coke, CRNA Pre-anesthesia Checklist: Patient identified, Emergency Drugs available, Suction available and Patient being monitored Patient Re-evaluated:Patient Re-evaluated prior to induction Oxygen Delivery Method: Circle System Utilized Preoxygenation: Pre-oxygenation with 100% oxygen Induction Type: IV induction Ventilation: Mask ventilation without difficulty Laryngoscope Size: Mac and 3 Grade View: Grade I Tube type: Oral Tube size: 7.0 mm Number of attempts: 1 Airway Equipment and Method: Stylet and Oral airway Placement Confirmation: ETT inserted through vocal cords under direct vision,  positive ETCO2 and breath sounds checked- equal and bilateral Secured at: 22 cm Tube secured with: Tape Dental Injury: Teeth and Oropharynx as per pre-operative assessment

## 2020-03-04 NOTE — Anesthesia Postprocedure Evaluation (Signed)
Anesthesia Post Note  Patient: Emily Bowers  Procedure(s) Performed: Thoracic Twelve- Lumbar One Laminectomy for intradural tumor resection (N/A Spine Lumbar)     Patient location during evaluation: PACU Anesthesia Type: General Level of consciousness: awake and alert, patient cooperative and oriented Pain management: pain level controlled Vital Signs Assessment: post-procedure vital signs reviewed and stable Respiratory status: spontaneous breathing, nonlabored ventilation and respiratory function stable Cardiovascular status: blood pressure returned to baseline and stable Postop Assessment: no apparent nausea or vomiting Anesthetic complications: no   No complications documented.  Last Vitals:  Vitals:   03/04/20 1515 03/04/20 1530  BP: 111/71 113/82  Pulse: 88 83  Resp: 16 20  Temp:  37 C  SpO2: 97% 96%    Last Pain:  Vitals:   03/04/20 1530  PainSc: 0-No pain                 Edythe Riches,E. Rutherford Alarie

## 2020-03-05 ENCOUNTER — Encounter (HOSPITAL_COMMUNITY): Payer: Self-pay | Admitting: Neurosurgery

## 2020-03-05 LAB — SURGICAL PATHOLOGY

## 2020-03-05 MED ORDER — HEPARIN SODIUM (PORCINE) 5000 UNIT/ML IJ SOLN
5000.0000 [IU] | Freq: Three times a day (TID) | INTRAMUSCULAR | Status: DC
  Administered 2020-03-05 – 2020-03-07 (×6): 5000 [IU] via SUBCUTANEOUS
  Filled 2020-03-05 (×6): qty 1

## 2020-03-05 NOTE — Progress Notes (Signed)
Patient ID: Rolene Arbour, female   DOB: 10-30-1995, 25 y.o.   MRN: [id number] BP 115/75 (BP Location: Right Arm)   Pulse 86   Temp 99.1 F (37.3 C) (Oral)   Resp (!) 22   Ht 5\' 7"  (1.702 m)   Wt 58.1 kg   LMP 02/20/2020 (Approximate)   SpO2 98%   BMI 20.06 kg/m  Alert and oriented x 4, speech clear and fluent Weakness left lower extremity Bedrest today

## 2020-03-06 NOTE — Progress Notes (Signed)
Patient ID: Emily Bowers, female   DOB: 01-Jan-1996, 25 y.o.   MRN: [id number] BP 110/74   Pulse 61   Temp 97.9 F (36.6 C) (Oral)   Resp 19   Ht 5\' 7"  (1.702 m)   Wt 58.1 kg   LMP 02/20/2020 (Approximate)   SpO2 98%   BMI 20.06 kg/m  Alert and oriented x 4, speech is clear and fluent Moving lower extremities better Wound is clean , no signs of infection.

## 2020-03-07 MED ORDER — OXYCODONE HCL ER 10 MG PO T12A: 10.0000 mg | EXTENDED_RELEASE_TABLET | Freq: Two times a day (BID) | ORAL | 0 refills | Status: AC

## 2020-03-07 MED ORDER — HYDROCODONE-ACETAMINOPHEN 5-325 MG PO TABS: 1.0000 | ORAL_TABLET | ORAL | 0 refills | Status: AC | PRN

## 2020-03-07 MED ORDER — DOCUSATE SODIUM 100 MG PO CAPS: 100.0000 mg | ORAL_CAPSULE | Freq: Two times a day (BID) | ORAL | 2 refills | Status: AC

## 2020-03-07 NOTE — Progress Notes (Signed)
Patient ID: Emily Bowers, female   DOB: 25-Feb-1995, 25 y.o.   MRN: [id number] BP 102/72   Pulse (!) 56   Temp 99.9 F (37.7 C) (Oral)   Resp (!) 21   Ht 5\' 7"  (1.702 m)   Wt 58.1 kg   LMP 02/20/2020 (Approximate)   SpO2 97%   BMI 20.06 kg/m  Alert and oriented x 4, speech is clear and fluent Moving lower extremities, mild weakness in the left lower extremity Wound is clean, dry, no signs of infection

## 2020-03-07 NOTE — Progress Notes (Signed)
CSW met with patient to discuss substance use concerns. CSW noted patient had a visitor and requested visitor to remain in the room during assessment. Patient denied any illicit substance use but reported some drinking. CSW noted patient was minimizing her ETOH use and when asked to clarify stated she drinks almost daily about 10-15 beers. CSW discussed treatment options and noted patient reported while she has thought about it before she declined referrals at this time. Patient did accept resources. CSW encouraged patient to consider treatment and to reach back out if she would like referral for treatment.    03/07/20 1022  OTHER  Substance Abuse Education Offered Yes  Substance abuse interventions Patient Counseling;Educational Materials;Referral to (must comment) (Referral offered but declined)  (CAGE-AID) Substance Abuse Screening Tool  Have You Ever Felt You Ought to Cut Down on Your Drinking or Drug Use? 1  Have People Annoyed You By Critizing Your Drinking Or Drug Use? 1  Have You Felt Bad Or Guilty About Your Drinking Or Drug Use? 0  Have You Ever Had a Drink or Used Drugs First Thing In The Morning to Steady Your Nerves or to Get Rid of a Hangover? 0  CAGE-AID Score 2

## 2020-03-07 NOTE — Evaluation (Signed)
Occupational Therapy Evaluation Patient Details Name: Emily Bowers MRN: [id number] DOB: [birth year] Today's Date: 03/07/2020    History of Present Illness Pt is a 25 yo female s/p T12-L1 Laminectomy for intradural tumor resection s/p cauda equina mass. PMHx: sleep apnea, knee arthroscopy   Clinical Impression   Pt PTA: Pt living alone, independent with ADL, mobility with crutch or RW due to pain in L LE. Pt currently, requires increased time for ADL; able to perform LB ADL with figure 4 technique. Pt education on back precautions and requirements for LB ADL. Pt education of AE to use as needed. Pt reports having reacher and sock aid. Pt supervisionA for ADL and supervisionA to minguardA for mobility with no AD and fair balance. Pt does not require continued OT skilled services as pt at her functional ADL and mobility. OT signing off.     Follow Up Recommendations  No OT follow up;Supervision - Intermittent (initially)    Equipment Recommendations  None recommended by OT    Recommendations for Other Services       Precautions / Restrictions Precautions Precautions: Back Precaution Booklet Issued: Yes (comment) Precaution Comments: handout provided and education completed Restrictions Weight Bearing Restrictions: No      Mobility Bed Mobility Overal bed mobility: Modified Independent             General bed mobility comments: cues for log roll technique    Transfers Overall transfer level: Needs assistance Equipment used: None Transfers: Sit to/from Stand Sit to Stand: Supervision;Modified independent (Device/Increase time)         General transfer comment: safe technique, no assist needed.    Balance Overall balance assessment: Mild deficits observed, not formally tested                                         ADL either performed or assessed with clinical judgement   ADL Overall ADL's : At baseline                                      Functional mobility during ADLs: Supervision/safety General ADL Comments: Pt requires increased time for ADL; able to perform LB ADL with figure 4 technique. Pt education on back precautions and requirements for LB ADL. Pt education of AE to use as needed. Pt reports having reacher and sock aid.     Vision Baseline Vision/History: No visual deficits Patient Visual Report: No change from baseline Vision Assessment?: Yes;No apparent visual deficits Eye Alignment: Within Functional Limits Ocular Range of Motion: Within Functional Limits Alignment/Gaze Preference: Within Defined Limits Additional Comments: Pt scanning environment; able to walk without having to look L or R peripherally to avoid obstacles.     Perception     Praxis      Pertinent Vitals/Pain Pain Assessment: Faces Faces Pain Scale: Hurts a little bit Pain Location: surgery site Pain Descriptors / Indicators: Discomfort;Tightness Pain Intervention(s): Monitored during session     Hand Dominance Right   Extremity/Trunk Assessment Upper Extremity Assessment Upper Extremity Assessment: Overall WFL for tasks assessed   Lower Extremity Assessment Lower Extremity Assessment: LLE deficits/detail LLE Deficits / Details: numb/feeling asleep, mildly uncoordinated LLE Coordination: decreased gross motor   Cervical / Trunk Assessment Cervical / Trunk Assessment: Other exceptions Cervical / Trunk Exceptions: surgical back   Communication  Communication Communication: No difficulties   Cognition Arousal/Alertness: Awake/alert Behavior During Therapy: WFL for tasks assessed/performed Overall Cognitive Status: Within Functional Limits for tasks assessed                                     General Comments  Pt's mother in room; pt education complete for back precautions and ways to incorporate safe movement.    Exercises     Shoulder Instructions      Home Living Family/patient expects to  be discharged to:: Private residence Living Arrangements: Alone Available Help at Discharge: Family;Available PRN/intermittently Type of Home: House Home Access: Stairs to enter [employer] of Steps: 3   Home Layout: One level     Bathroom Shower/Tub: Occupational psychologist: Standard     Home Equipment: Crutches;Bedside commode;Walker - 2 wheels          Prior Functioning/Environment Level of Independence: Needs assistance  Gait / Transfers Assistance Needed: Increased time; used crutch ADL's / Homemaking Assistance Needed: Independent   Comments: Emily Bowers lives 7 miles away        OT Problem List: Decreased activity tolerance;Pain      OT Treatment/Interventions:      OT Goals(Current goals can be found in the care plan section) Acute Rehab OT Goals Patient Stated Goal: to go home OT Goal Formulation: All assessment and education complete, DC therapy Potential to Achieve Goals: Good  OT Frequency:     Barriers to D/C:            Co-evaluation              AM-PAC OT "6 Clicks" Daily Activity     Outcome Measure Help from another person eating meals?: None Help from another person taking care of personal grooming?: None Help from another person toileting, which includes using toliet, bedpan, or urinal?: None Help from another person bathing (including washing, rinsing, drying)?: None Help from another person to put on and taking off regular upper body clothing?: None Help from another person to put on and taking off regular lower body clothing?: None 6 Click Score: 24   End of Session Nurse Communication: Mobility status  Activity Tolerance: Patient tolerated treatment well Patient left: in bed;with call bell/phone within reach;with family/visitor present  OT Visit Diagnosis: Unsteadiness on feet (R26.81);Muscle weakness (generalized) (M62.81)                Time: 0349-1791 OT Time Calculation (min): 27 min Charges:  OT General  Charges $OT Visit: 1 Visit OT Evaluation $OT Eval Moderate Complexity: 1 Mod  Jefferey Pica, OTR/L Acute Rehabilitation Services Pager: [phone] Office: [phone]   [id number] C 03/07/2020, 12:43 PM

## 2020-03-07 NOTE — Discharge Summary (Signed)
  Physician Discharge Summary  Patient ID: Emily Bowers MRN: [id number] DOB/AGE: 09-04-1995 25 y.o.  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)   Discharged Condition: Stable  Hospital Course:  Emily Bowers is a 25 y.o. female who underwent laminectomy and resection of a spinal intradural mass, thought to be either an ependymoma or nerve sheath tumor.  Postoperatively, she was mobilized with PT, OT, and her pain was controlled with vicodin and oxycodone.  On the day of discharge, she was at her preoperative neurologic baseline, was cleared by PT/OT, and was seen and examined by Dr. Christella Noa who deemed her ready for d/c home on 03/07/20.   Treatments: Surgery - laminectomy and resection of intradural spinal mass  Discharge Exam: Blood pressure 114/63, pulse 94, temperature 99.9 F (37.7 C), temperature source Oral, resp. rate 19, height 5\' 7"  (1.702 m), weight 58.1 kg, last menstrual period 02/20/2020, SpO2 96 %. Awake, alert, oriented Speech fluent, appropriate CN grossly intact 5/5 BUE/BLE Wound c/d/i  Disposition: Discharge disposition: 01-Home or Self Care        Allergies as of 03/07/2020   No Known Allergies     Medication List    TAKE these medications   albuterol 108 (90 Base) MCG/ACT inhaler Commonly known as: VENTOLIN HFA Inhale 2 puffs into the lungs every 6 (six) hours as needed for wheezing or shortness of breath.   docusate sodium 100 MG capsule Commonly known as: COLACE Take 1 capsule (100 mg total) by mouth 2 (two) times daily.   HYDROcodone-acetaminophen 5-325 MG tablet Commonly known as: NORCO/VICODIN Take 1 tablet by mouth every 4 (four) hours as needed for moderate pain ((score 4 to 6)).   ibuprofen 200 MG tablet Commonly known as: ADVIL Take 800 mg by mouth every 6 (six) hours as needed  for moderate pain or headache.   Lidocaine 4 % Ptch Apply 1 patch topically daily as needed (pain).   montelukast 10 MG tablet Commonly known as: SINGULAIR Take 10 mg by mouth daily as needed (allergies).   oxyCODONE 10 mg 12 hr tablet Commonly known as: OXYCONTIN Take 1 tablet (10 mg total) by mouth every 12 (twelve) hours for 7 days.       Follow-up Information    Ashok Pall, MD Follow up in 2 week(s).   Specialty: Neurosurgery Contact information: [address] [place] [address] [phone]               Signed: Vallarie Mare 03/07/2020, 1:41 PM

## 2020-03-07 NOTE — Discharge Instructions (Signed)
° ° ° ° ° ° ° °  No heavy lifting >10 lbs No excessive bending/twisting at the waist                  Intensive Outpatient Programs  [facility] [address]     [place] [address] [place],  [place], [place]      [facility]  (Inpatient and outpatient)  [phone] (Suboxone and Methadone) [address]           [phone]           [facility]: [facility]    [facility] - Intensive Outpatient [address]     [address] [place], [place] [address]     [place], [place]      [phone]  [facility] (Outpatient, Inpatient, [place]  [facility] (Groups and [facility]idental) (insurance only) [phone]    [place], [place]       [facility]    [facility] (for caregivers and family) [address]     [address] [place], [place], [place]      [phone]  Residential Treatment Programs  [place]  [facility](2 years) Residential: 29 days)  [facility] ([place], [place], [place] [phone]      [phone] or [phone]  [facility]    [facility] [address]      [place], [place], [place]      [phone]  [place]   [facility] ([facility]) [place]     [address] [place], [place] [address]     [place], [place]      [phone] Admissions: 8am-3pm M-F  [facility]: Residential Program [phone] Days)              [facility]: [facility]  [place], [place], [place] or [phone]    (Walk in Hours over the weekend or by  referral)   [facility] Crisis: [facility]:[phone] (for crisis response 24 hours a day)

## 2020-03-07 NOTE — Progress Notes (Signed)
Physical Therapy Evaluation Patient Details Name: Emily Bowers MRN: [id number] DOB: [birth year] Today's Date: 03/07/2020   History of Present Illness  Pt is a 25 yo female s/p T12-L1 Laminectomy for intradural tumor resection s/p cauda equina mass. PMHx: sleep apnea, knee arthroscopy  Clinical Impression  Pt is close to baseline functioning and should be safe at home with minor assist from family. There are no further acute PT needs.  Will sign off at this time.     Follow Up Recommendations No PT follow up    Equipment Recommendations  None recommended by PT    Recommendations for Other Services       Precautions / Restrictions Precautions Precautions: Back Precaution Booklet Issued: Yes (comment)      Mobility  Bed Mobility Overal bed mobility: Modified Independent             General bed mobility comments: cues for transition from hooklyiing, rolling to side and up from side.    Transfers Overall transfer level: Needs assistance Equipment used: None Transfers: Sit to/from Stand Sit to Stand: Supervision;Modified independent (Device/Increase time)         General transfer comment: safe technique, no assist needed.  Ambulation/Gait Ambulation/Gait assistance: Supervision Gait Distance (Feet): 200 Feet Assistive device: None (choosing to stay close to the rail) Gait Pattern/deviations: Step-through pattern     General Gait Details: generally steady with noticeably incoordination in L LE due to altered sensation.  Stairs Stairs: Yes Stairs assistance: Supervision;Modified independent (Device/Increase time) Stair Management: One rail Right;Alternating pattern;Step to pattern;Forwards Number of Stairs: 10 General stair comments: safe with rail, chooses step to pattern for safety, but can alternate.  Wheelchair Mobility    Modified Rankin (Stroke Patients Only)       Balance Overall balance assessment: Mild deficits observed, not formally tested                                            Pertinent Vitals/Pain Pain Assessment: Faces Faces Pain Scale: Hurts a little bit Pain Location: surgery site Pain Descriptors / Indicators: Discomfort;Tightness Pain Intervention(s): Monitored during session    Home Living Family/patient expects to be discharged to:: Private residence Living Arrangements: Alone Available Help at Discharge: Family;Available PRN/intermittently Type of Home: House Home Access: Stairs to enter   Entrance Stairs-Number of Steps: 3 Home Layout: One level Home Equipment: Crutches;Bedside commode;Walker - 2 wheels      Prior Function Level of Independence: Needs assistance   Gait / Transfers Assistance Needed: Increased time; used crutch  ADL's / Homemaking Assistance Needed: Independent  Comments: Mama lives 7 miles away     Hand Dominance   Dominant Hand: Right    Extremity/Trunk Assessment   Upper Extremity Assessment Upper Extremity Assessment: Defer to OT evaluation    Lower Extremity Assessment Lower Extremity Assessment: LLE deficits/detail;Overall WFL for tasks assessed LLE Deficits / Details: numb/feeling asleep, mildly uncoordinated    Cervical / Trunk Assessment Cervical / Trunk Assessment: Other exceptions Cervical / Trunk Exceptions: surgical back  Communication   Communication: No difficulties  Cognition Arousal/Alertness: Awake/alert Behavior During Therapy: WFL for tasks assessed/performed Overall Cognitive Status: Within Functional Limits for tasks assessed  General Comments General comments (skin integrity, edema, etc.): pt instructed in back care/prec, log roll, lifting restrictions, progression of activity.  All advised.    Exercises     Assessment/Plan    PT Assessment Patent does not need any further PT services  PT Problem List         PT Treatment Interventions      PT Goals (Current  goals can be found in the Care Plan section)  Acute Rehab PT Goals PT Goal Formulation: All assessment and education complete, DC therapy    Frequency     Barriers to discharge        Co-evaluation               AM-PAC PT "6 Clicks" Mobility  Outcome Measure Help needed turning from your back to your side while in a flat bed without using bedrails?: None Help needed moving from lying on your back to sitting on the side of a flat bed without using bedrails?: None Help needed moving to and from a bed to a chair (including a wheelchair)?: None Help needed standing up from a chair using your arms (e.g., wheelchair or bedside chair)?: None Help needed to walk in hospital room?: A Little Help needed climbing 3-5 steps with a railing? : A Little 6 Click Score: 22    End of Session   Activity Tolerance: Patient tolerated treatment well Patient left: Other (comment) (up in bathroom with OT) Nurse Communication: Mobility status PT Visit Diagnosis: Other abnormalities of gait and mobility (R26.89);Pain Pain - part of body:  (back)    Time: 2924-4628 PT Time Calculation (min) (ACUTE ONLY): 25 min   Charges:   PT Evaluation $PT Eval Moderate Complexity: 1 Mod          03/07/2020  Ginger Carne., PT Acute Rehabilitation Services [phone]  (pager) [phone]  (office)  Tessie Fass Dionis Autry 03/07/2020, 12:10 PM

## 2021-11-05 IMAGING — CR DG THORACOLUMBAR SPINE 2V
8 series · 8 of 8 positions shown · non-contrast
Comparison: MRI of the lumbar spine 01/24/2020.

CLINICAL DATA: Intraoperative localization for patient undergoing
lumbar spine tumor section.

EXAM:
THORACOLUMBAR SPINE 1V

[lateral (1 of 8)]
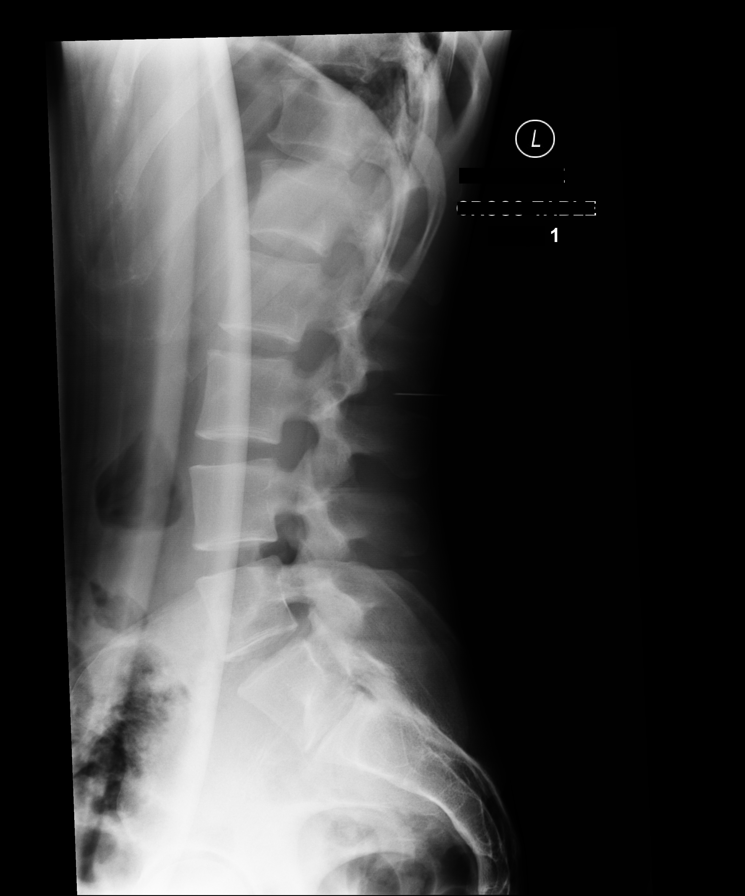

[lateral (2 of 8)]
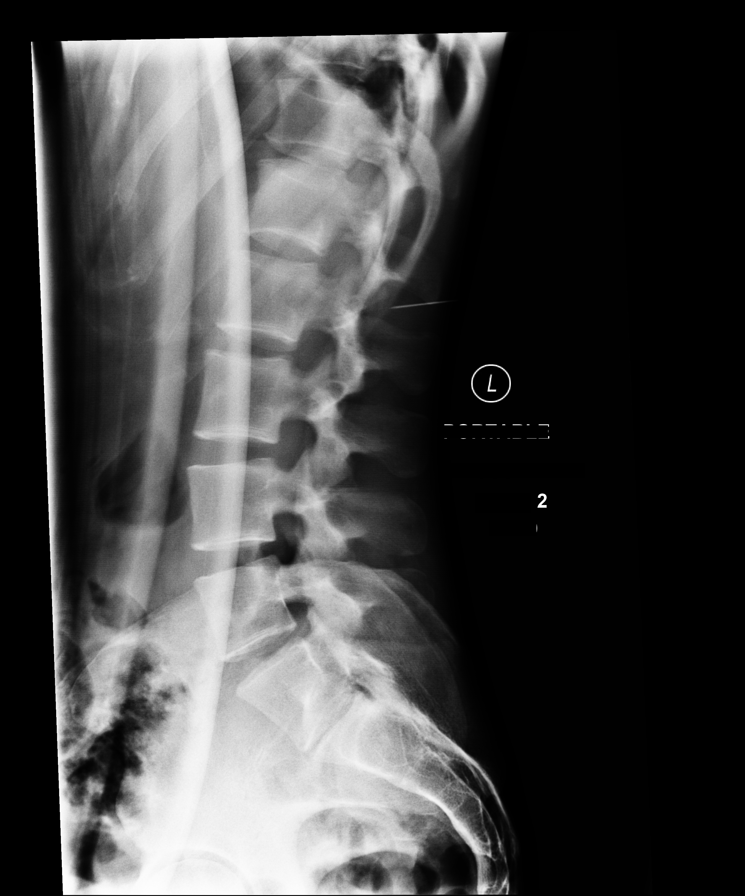

[lateral (3 of 8)]
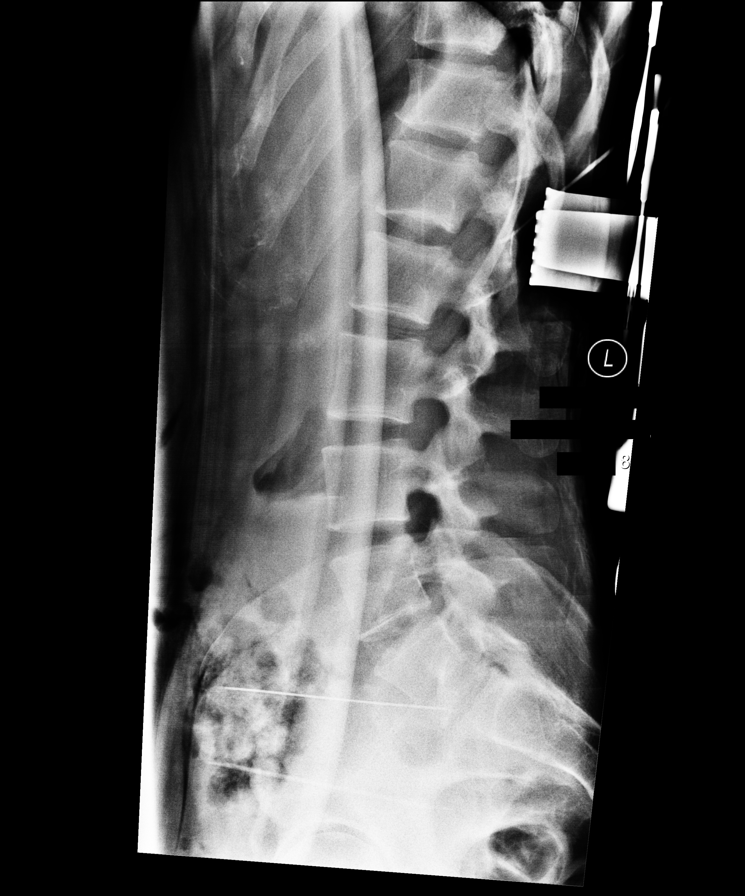

[lateral (4 of 8)]
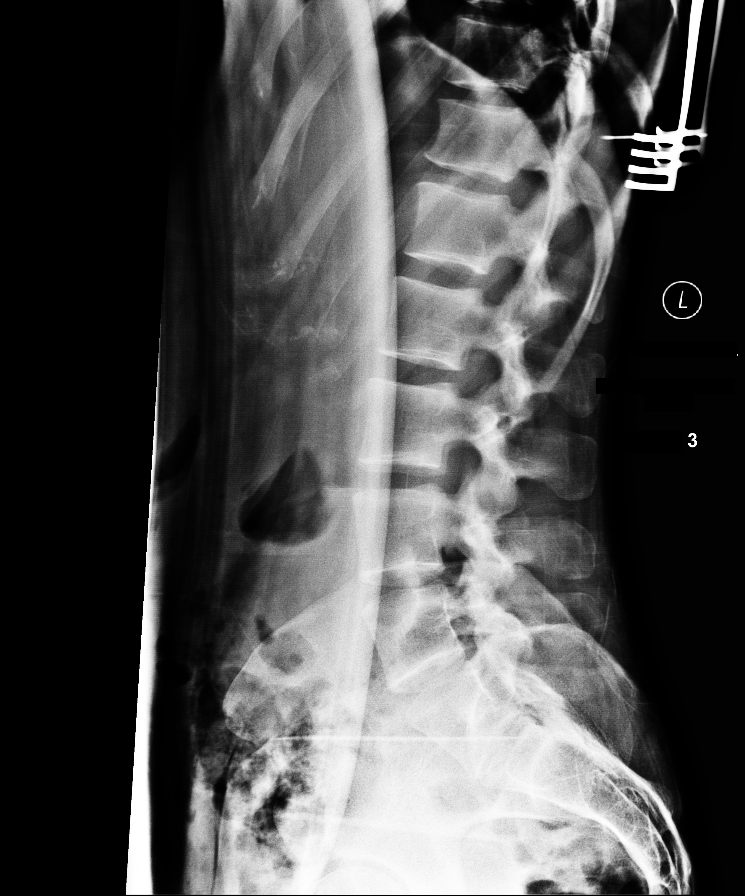

[lateral (5 of 8)]
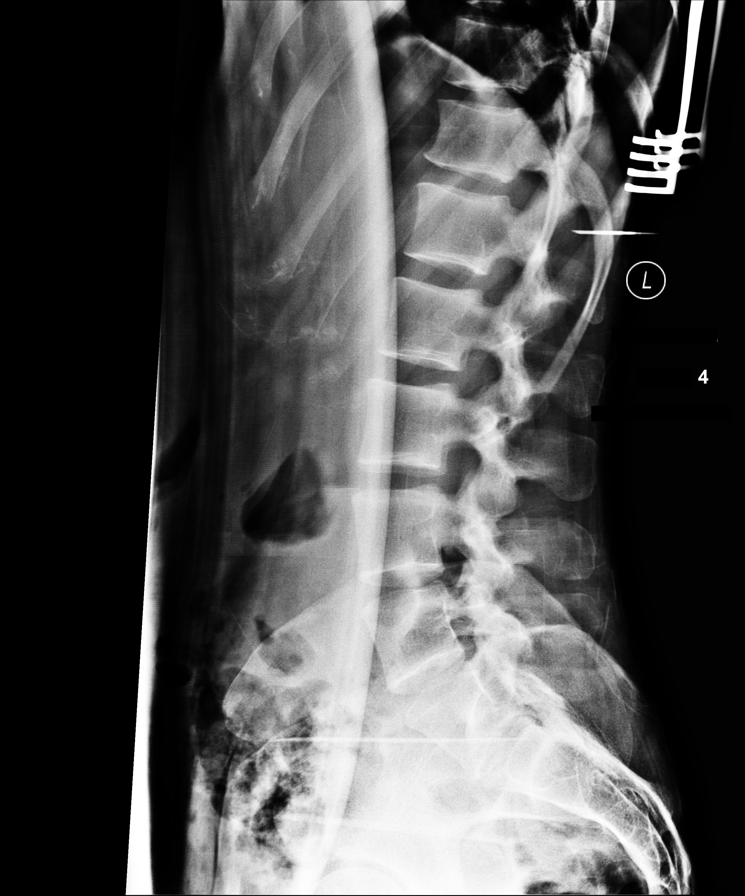

[lateral (6 of 8)]
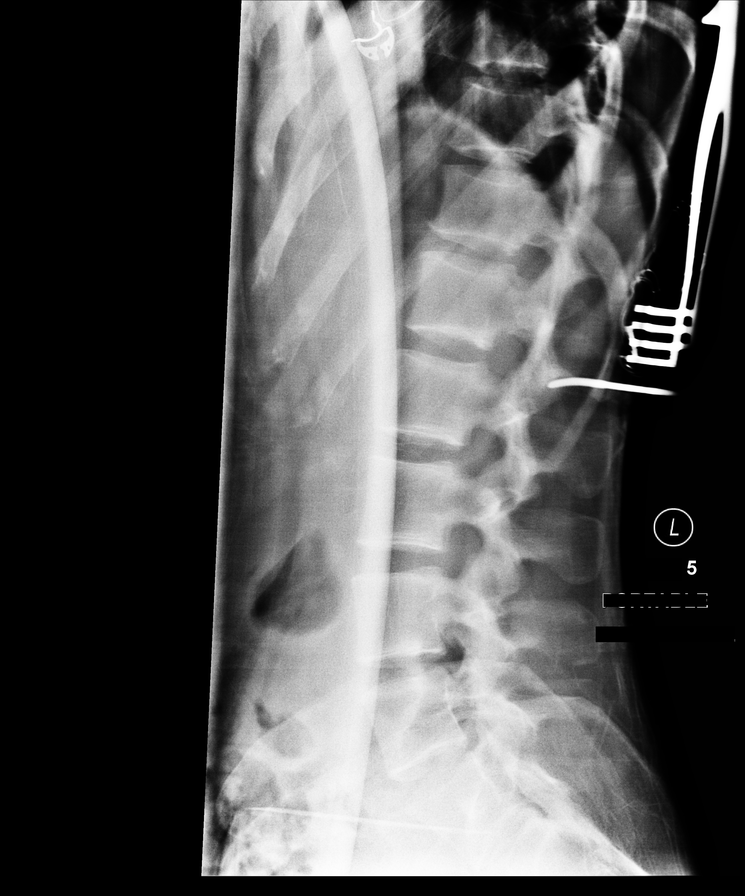

[lateral (7 of 8)]
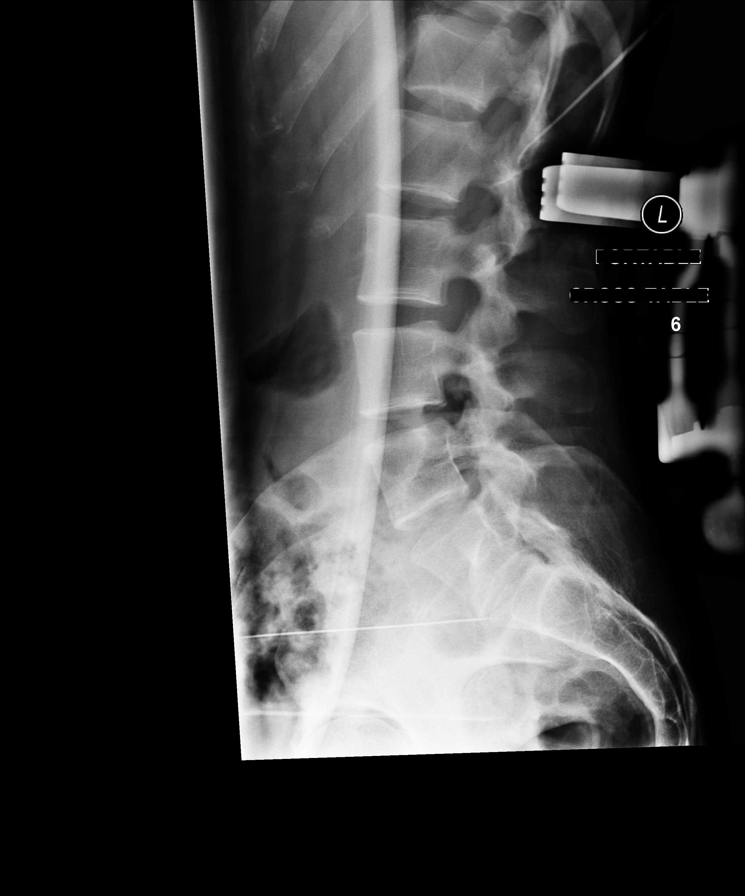

[lateral (8 of 8)]
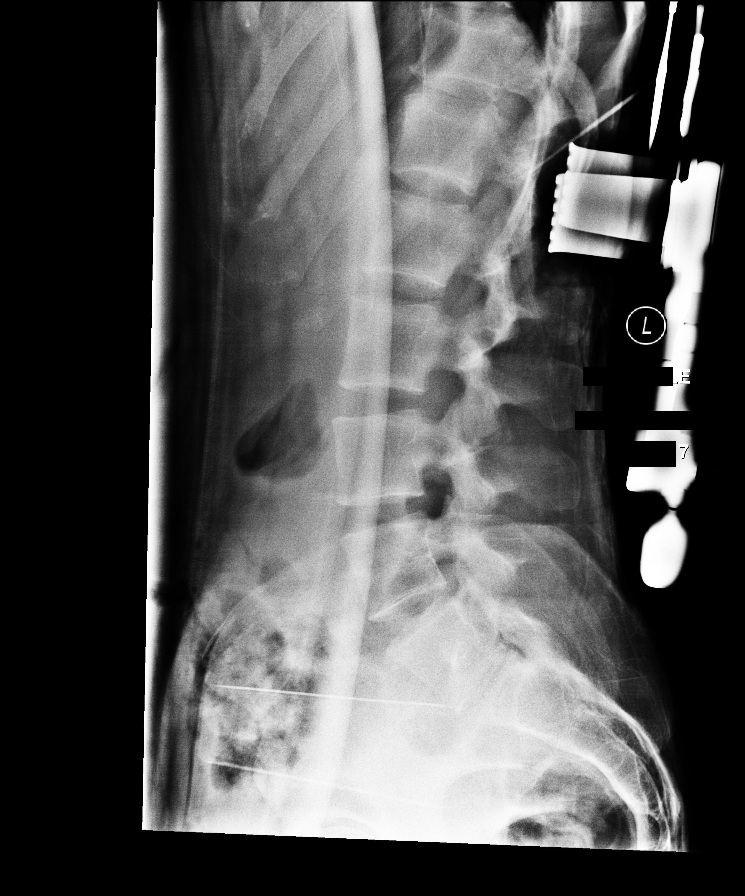

[8 of 8 positions shown; findings below may reference images not displayed]

FINDINGS: The patient has transitional lumbosacral anatomy. Numbering scheme
on this study is based on the lumbar spine MRI, where numbering is
based on the lowest visible pair of ribs on the prior MRI. Based on
this scheme, the patient has rudimentary disc material at S1-2

A series of intraoperative views in the lateral projection are
provided. Images demonstrate localization of the T12-L1 level. On
the final image, there is a clamp on the T12 spinous process and a
probe with its tip projecting at the inferior aspect of the T12
pedicles.
IMPRESSION: Localization as above.
